# Patient Record
Sex: Male | Born: 1985 | Race: Black or African American | Hispanic: No | Marital: Single | Smoking: Never smoker
Health system: Southern US, Community
[De-identification: ages and names within clinical notes are randomized; demographics above are authoritative.]

## PROBLEM LIST (undated history)

## (undated) DIAGNOSIS — I509 Heart failure, unspecified: Secondary | ICD-10-CM

## (undated) DIAGNOSIS — J939 Pneumothorax, unspecified: Secondary | ICD-10-CM

## (undated) DIAGNOSIS — Q213 Tetralogy of Fallot: Secondary | ICD-10-CM

## (undated) HISTORY — PX: LUNG SURGERY: SHX703

## (undated) HISTORY — PX: CARDIAC SURGERY: SHX584

---

## 2016-08-13 ENCOUNTER — Emergency Department: Payer: Self-pay

## 2016-08-13 ENCOUNTER — Emergency Department
Admission: EM | Admit: 2016-08-13 | Discharge: 2016-08-13 | Disposition: A | Discharge status: Home / Self Care | Payer: Self-pay | Attending: Emergency Medicine | Admitting: Emergency Medicine

## 2016-08-13 ENCOUNTER — Encounter: Payer: Self-pay | Admitting: Emergency Medicine

## 2016-08-13 DIAGNOSIS — W228XXA Striking against or struck by other objects, initial encounter: Secondary | ICD-10-CM | POA: Insufficient documentation

## 2016-08-13 DIAGNOSIS — Y929 Unspecified place or not applicable: Secondary | ICD-10-CM | POA: Insufficient documentation

## 2016-08-13 DIAGNOSIS — S0990XA Unspecified injury of head, initial encounter: Secondary | ICD-10-CM | POA: Insufficient documentation

## 2016-08-13 DIAGNOSIS — Z79899 Other long term (current) drug therapy: Secondary | ICD-10-CM | POA: Insufficient documentation

## 2016-08-13 DIAGNOSIS — Z7982 Long term (current) use of aspirin: Secondary | ICD-10-CM | POA: Insufficient documentation

## 2016-08-13 DIAGNOSIS — Y9389 Activity, other specified: Secondary | ICD-10-CM | POA: Insufficient documentation

## 2016-08-13 DIAGNOSIS — I509 Heart failure, unspecified: Secondary | ICD-10-CM | POA: Insufficient documentation

## 2016-08-13 DIAGNOSIS — Y998 Other external cause status: Secondary | ICD-10-CM | POA: Insufficient documentation

## 2016-08-13 HISTORY — DX: Pneumothorax, unspecified: J93.9

## 2016-08-13 HISTORY — DX: Heart failure, unspecified: I50.9

## 2016-08-13 HISTORY — DX: Tetralogy of Fallot: Q21.3

## 2016-08-13 NOTE — Discharge Instructions (Signed)
Followed discharge care instruction take only Tylenol for pain.
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
## 2017-06-24 ENCOUNTER — Encounter: Payer: Self-pay | Admitting: Emergency Medicine

## 2017-06-24 ENCOUNTER — Other Ambulatory Visit: Payer: Self-pay

## 2017-06-24 ENCOUNTER — Emergency Department
Admission: EM | Admit: 2017-06-24 | Discharge: 2017-06-24 | Disposition: A | Discharge status: Home / Self Care | Payer: No Typology Code available for payment source | Attending: Emergency Medicine | Admitting: Emergency Medicine

## 2017-06-24 DIAGNOSIS — Y9389 Activity, other specified: Secondary | ICD-10-CM | POA: Diagnosis not present

## 2017-06-24 DIAGNOSIS — Y92410 Unspecified street and highway as the place of occurrence of the external cause: Secondary | ICD-10-CM | POA: Diagnosis not present

## 2017-06-24 DIAGNOSIS — S161XXA Strain of muscle, fascia and tendon at neck level, initial encounter: Secondary | ICD-10-CM

## 2017-06-24 DIAGNOSIS — M542 Cervicalgia: Secondary | ICD-10-CM | POA: Diagnosis not present

## 2017-06-24 DIAGNOSIS — Y999 Unspecified external cause status: Secondary | ICD-10-CM | POA: Insufficient documentation

## 2017-06-24 DIAGNOSIS — Z79899 Other long term (current) drug therapy: Secondary | ICD-10-CM | POA: Diagnosis not present

## 2017-06-24 DIAGNOSIS — S199XXA Unspecified injury of neck, initial encounter: Secondary | ICD-10-CM | POA: Diagnosis present

## 2017-06-24 MED ORDER — CYCLOBENZAPRINE HCL 10 MG PO TABS: 10.0000 mg | ORAL_TABLET | Freq: Three times a day (TID) | ORAL | 0 refills | Status: AC | PRN

## 2017-06-24 MED ORDER — NAPROXEN 500 MG PO TABS: 500.0000 mg | ORAL_TABLET | Freq: Two times a day (BID) | ORAL | 0 refills | Status: AC
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
